# Patient Record
Sex: Male | Born: 1995 | Race: White | Hispanic: No | Marital: Single | Smoking: Never smoker
Health system: Southern US, Community
[De-identification: ages and names within clinical notes are randomized; demographics above are authoritative.]

---
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
## 2020-07-24 ENCOUNTER — Telehealth: Payer: Managed Care, Other (non HMO) | Admitting: Emergency Medicine

## 2020-07-24 DIAGNOSIS — J069 Acute upper respiratory infection, unspecified: Secondary | ICD-10-CM

## 2020-07-24 MED ORDER — BENZONATATE 100 MG PO CAPS: 100.0000 mg | ORAL_CAPSULE | Freq: Two times a day (BID) | ORAL | 0 refills | Status: AC | PRN

## 2020-07-24 NOTE — Progress Notes (Signed)
E-Visit for Corona Virus Screening  Your current symptoms could be consistent with the coronavirus.  Many health care providers can now test patients at their office but not all are.  [facility] has multiple testing sites. For information on our COVID testing locations and hours go to [email or web address]  We are enrolling you in our MyChart Home Monitoring for COVID19 . Daily you will receive a questionnaire within the MyChart website. Our COVID 19 response team will be monitoring your responses daily.  Testing Information: The COVID-19 Community Testing sites are testing BY APPOINTMENT ONLY.  You can schedule online at [email or web address]  If you do not have access to a smart phone or computer you may call [phone] for an appointment.   Additional testing sites in the Community:  . For [facility] Testing sites in [facility]  [email or web address]  . For [facility] testing sites in [place]  [email or web address]  . For [email or web address]  . For [facility] testing in [place] and [email or web address]  . For [facility] testing in [facility]   [email or web address]  For  more information about community testing call [phone]   Please quarantine yourself while awaiting your test results. Please stay home for a minimum of 10 days from the first day of illness with improving symptoms and you have had 24 hours of no fever (without the use of Tylenol (Acetaminophen) Motrin (Ibuprofen) or any fever reducing medication).  Also - Do not get tested prior to returning to work because once you have had a positive test the test can stay  positive for more than a month in some cases.   You should wear a mask or cloth face covering over your nose and mouth if you must be around other people or animals, including pets (even at home). Try to stay at least 6 feet away from other people. This will protect the people around you.  Please continue good preventive care measures, including:  frequent hand-washing, avoid touching your face, cover coughs/sneezes, stay out of crowds and keep a 6 foot distance from others.  COVID-19 is a respiratory illness with symptoms that are similar to the flu. Symptoms are typically mild to moderate, but there have been cases of severe illness and death due to the virus.   The following symptoms may appear 2-14 days after exposure: . Fever . Cough . Shortness of breath or difficulty breathing . Chills . Repeated shaking with chills . Muscle pain . Headache . Sore throat . New loss of taste or smell . Fatigue . Congestion or runny nose . Nausea or vomiting . Diarrhea  Go to the nearest hospital ED for assessment if fever/cough/breathlessness are severe or illness seems like a threat to life.  It is vitally important that if you feel that you have an infection such as this virus or any other virus that you stay home and away from places where you may spread it to others.  You should avoid contact with people age 62 and older.   If you're still having symptom from your fall, you should be seen in person.  You can use medication such as A prescription cough medication called Tessalon Perles 100 mg. You may take 1-2 capsules every 8 hours as needed for cough  You may also take acetaminophen (Tylenol) as needed for fever.  Reduce your risk of any infection by using the same precautions used for avoiding the common cold or flu:  Marland Kitchen Wash your hands often with soap  and warm water for at least 20 seconds.  If soap and water are not readily available, use an alcohol-based hand sanitizer with at least 60%  alcohol.  . If coughing or sneezing, cover your mouth and nose by coughing or sneezing into the elbow areas of your shirt or coat, into a tissue or into your sleeve (not your hands). . Avoid shaking hands with others and consider head nods or verbal greetings only. . Avoid touching your eyes, nose, or mouth with unwashed hands.  . Avoid close contact with people who are sick. . Avoid places or events with large numbers of people in one location, like concerts or sporting events. . Carefully consider travel plans you have or are making. . If you are planning any travel outside or inside the [place], visit the CDC's Travelers' Health webpage for the latest health notices. . If you have some symptoms but not all symptoms, continue to monitor at home and seek medical attention if your symptoms worsen. . If you are having a medical emergency, call 911.  HOME CARE . Only take medications as instructed by your medical team. . Drink plenty of fluids and get plenty of rest. . A steam or ultrasonic humidifier can help if you have congestion.   GET HELP RIGHT AWAY IF YOU HAVE EMERGENCY WARNING SIGNS** FOR COVID-19. If you or someone is showing any of these signs seek emergency medical care immediately. Call 911 or proceed to your closest emergency facility if: . You develop worsening high fever. . Trouble breathing . Bluish lips or face . Persistent pain or pressure in the chest . New confusion . Inability to wake or stay awake . You cough up blood. . Your symptoms become more severe  **This list is not all possible symptoms. Contact your medical provider for any symptoms that are sever or concerning to you.  MAKE SURE YOU   Understand these instructions.  Will watch your condition.  Will get help right away if you are not doing well or get worse.  Your e-visit answers were reviewed by a board certified advanced clinical practitioner to complete your personal care plan.  Depending on the  condition, your plan could have included both over the counter or prescription medications.  If there is a problem please reply once you have received a response from your provider.  Your safety is important to [place].  If you have drug allergies check your prescription carefully.    You can use MyChart to ask questions about today's visit, request a non-urgent call back, or ask for a work or school excuse for 24 hours related to this e-Visit. If it has been greater than 24 hours you will need to follow up with your provider, or enter a new e-Visit to address those concerns. You will get an e-mail in the next two days asking about your experience.  I hope that your e-visit has been valuable and will speed your recovery. Thank you for using e-visits.   Approximately 5 minutes was used in reviewing the patient's chart, questionnaire, prescribing medications, and documentation.
